# Patient Record
Sex: Female | Born: 1970 | Race: White | Hispanic: No | Marital: Single | Smoking: Current every day smoker
Health system: Southern US, Community
[De-identification: ages and names within clinical notes are randomized; demographics above are authoritative.]

## PROBLEM LIST (undated history)

## (undated) DIAGNOSIS — B1920 Unspecified viral hepatitis C without hepatic coma: Secondary | ICD-10-CM

## (undated) DIAGNOSIS — E119 Type 2 diabetes mellitus without complications: Secondary | ICD-10-CM

---

## 2007-01-06 ENCOUNTER — Emergency Department (HOSPITAL_COMMUNITY): Admission: EM | Admit: 2007-01-06 | Discharge: 2007-01-06 | Payer: Self-pay | Admitting: Emergency Medicine

## 2014-01-07 ENCOUNTER — Other Ambulatory Visit: Payer: Self-pay | Admitting: Nurse Practitioner

## 2014-01-07 DIAGNOSIS — C220 Liver cell carcinoma: Secondary | ICD-10-CM

## 2014-01-17 ENCOUNTER — Other Ambulatory Visit: Payer: Self-pay | Admitting: Nurse Practitioner

## 2014-01-17 ENCOUNTER — Ambulatory Visit
Admission: RE | Admit: 2014-01-17 | Discharge: 2014-01-17 | Disposition: A | Discharge status: Home / Self Care | Payer: Medicaid Other | Source: Ambulatory Visit | Attending: Nurse Practitioner | Admitting: Nurse Practitioner

## 2014-01-17 DIAGNOSIS — C220 Liver cell carcinoma: Secondary | ICD-10-CM

## 2015-05-26 ENCOUNTER — Other Ambulatory Visit (HOSPITAL_COMMUNITY): Payer: Self-pay | Admitting: Nurse Practitioner

## 2015-05-26 DIAGNOSIS — B1920 Unspecified viral hepatitis C without hepatic coma: Secondary | ICD-10-CM

## 2015-09-23 IMAGING — US US ABDOMEN LIMITED
1 series · 14 of 25 positions shown · non-contrast
Comparison: Ultrasound February 17, 2006.

CLINICAL DATA: Right upper quadrant abdominal pain.

EXAM:
US ABDOMEN LIMITED - RIGHT UPPER QUADRANT

[Series 1: us abdomen limited · 0.23mm/px · 14 of 29 slices shown]
[im 1/29]
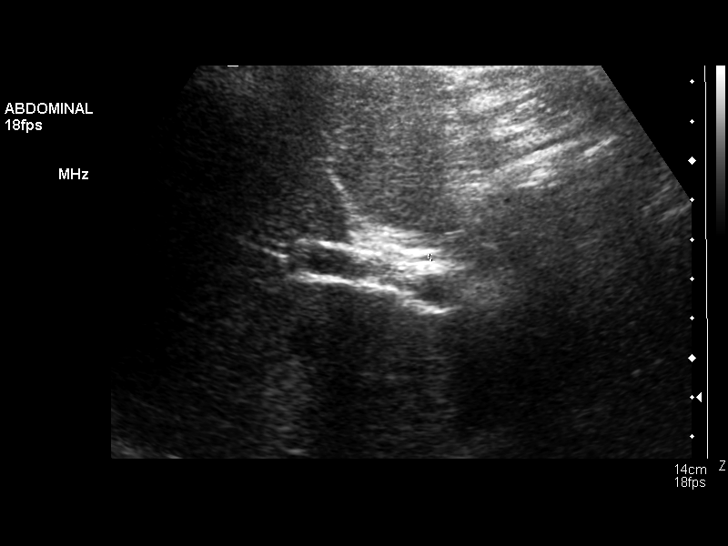
[im 3/29]
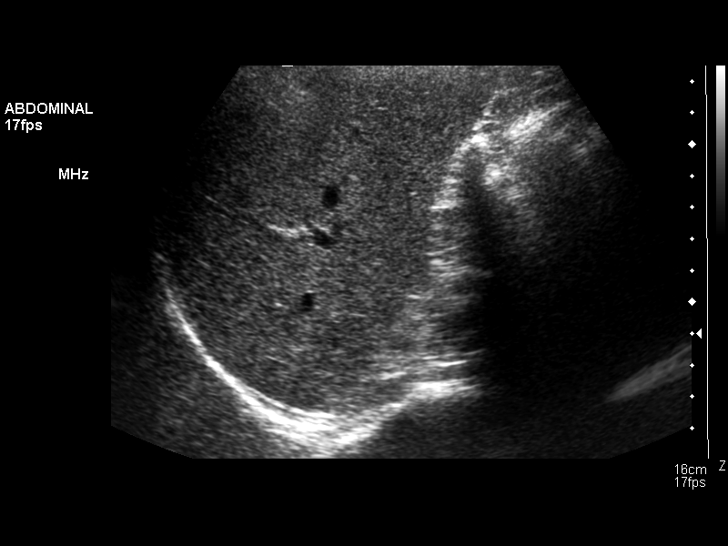
[im 5/29]
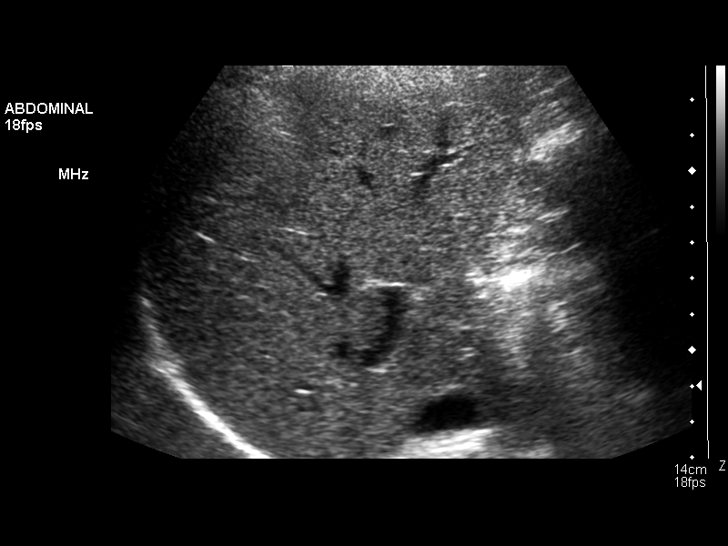
[im 8/29]
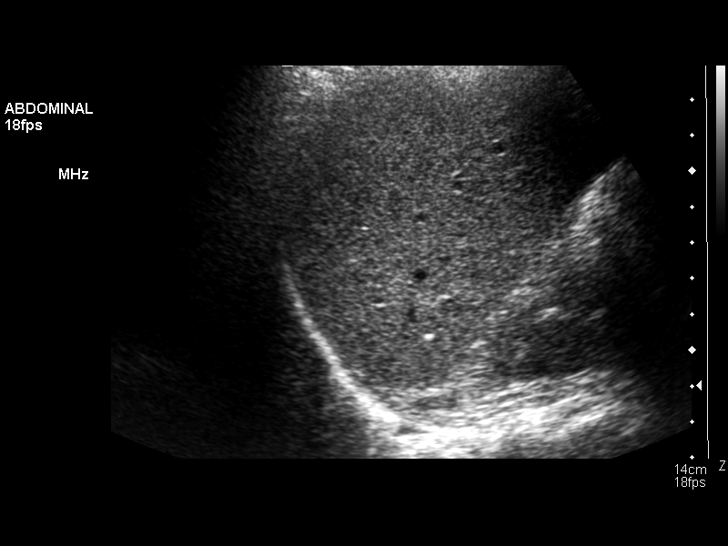
[im 10/29]
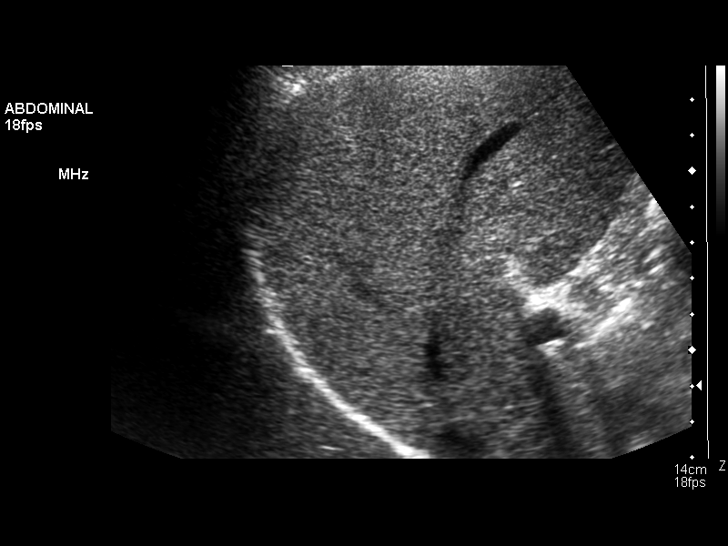
[im 11/29]
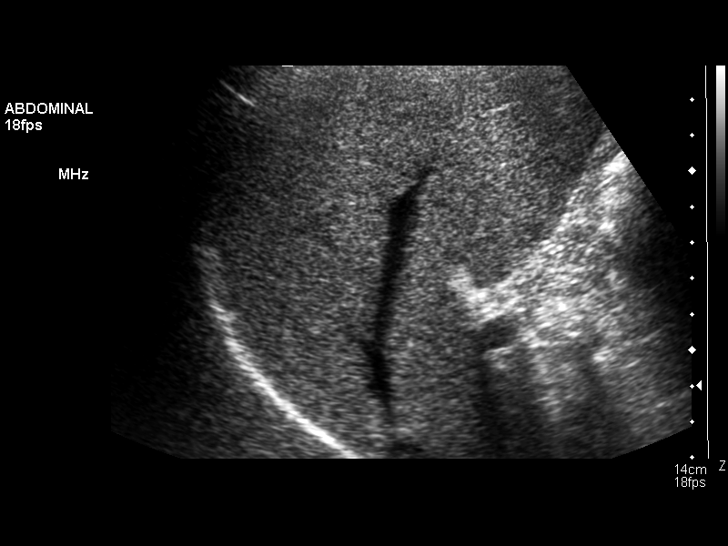
[im 13/29]
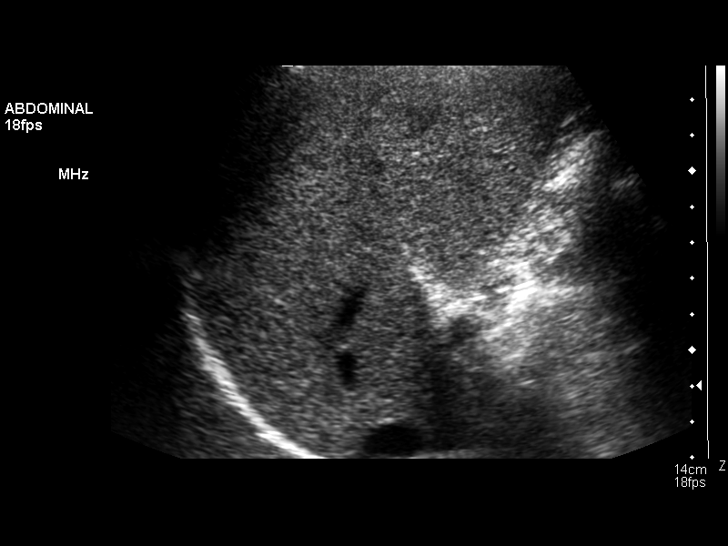
[im 16/29]
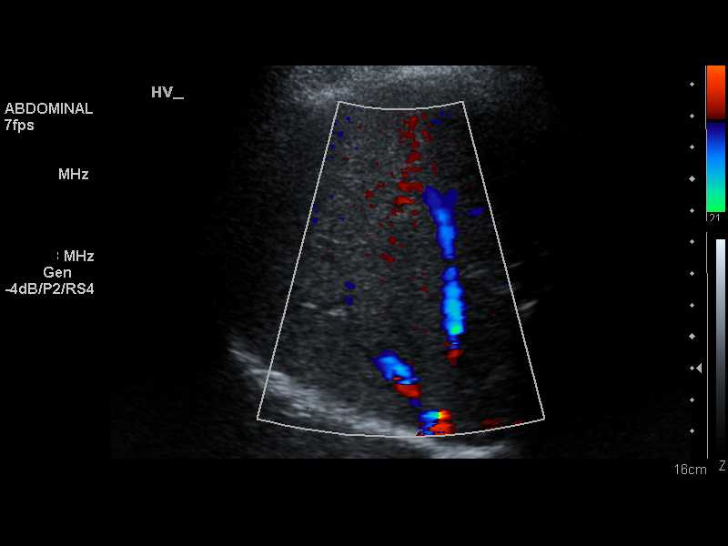
[im 18/29]
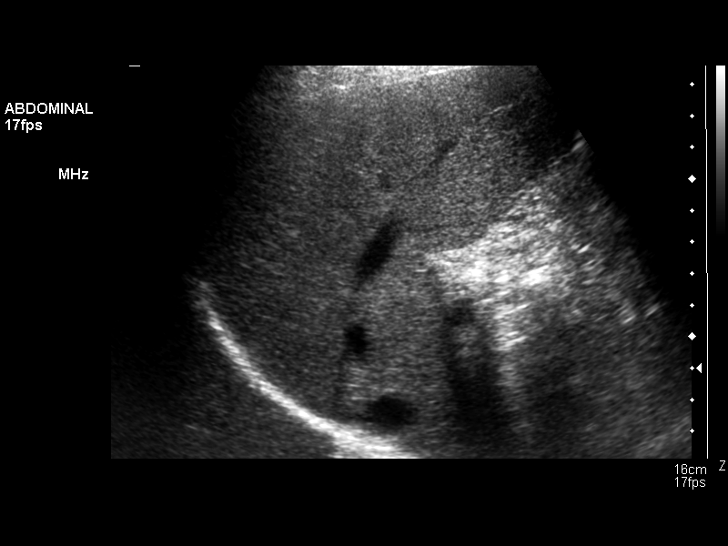
[im 19/29]
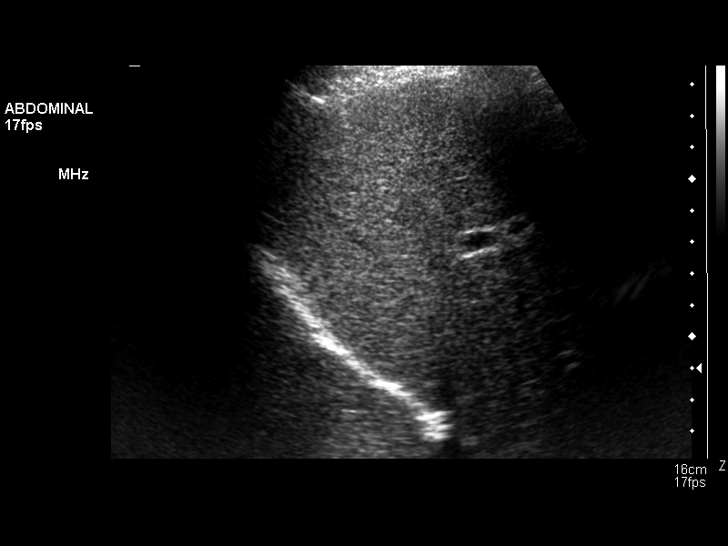
[im 22/29]
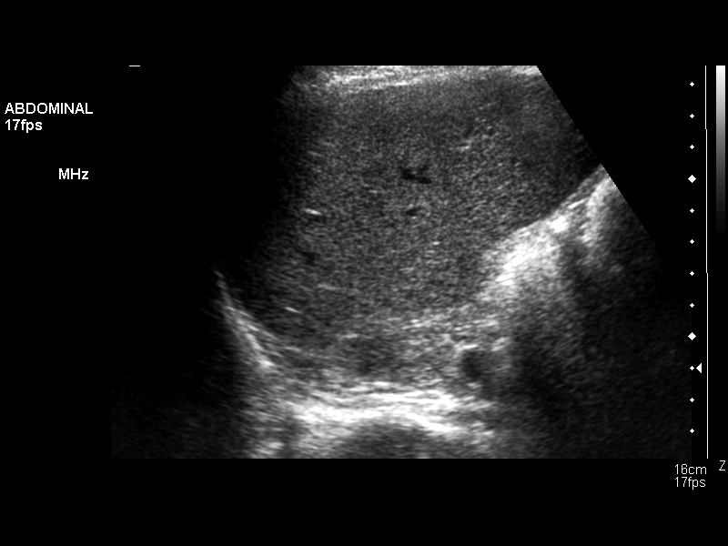
[im 24/29]
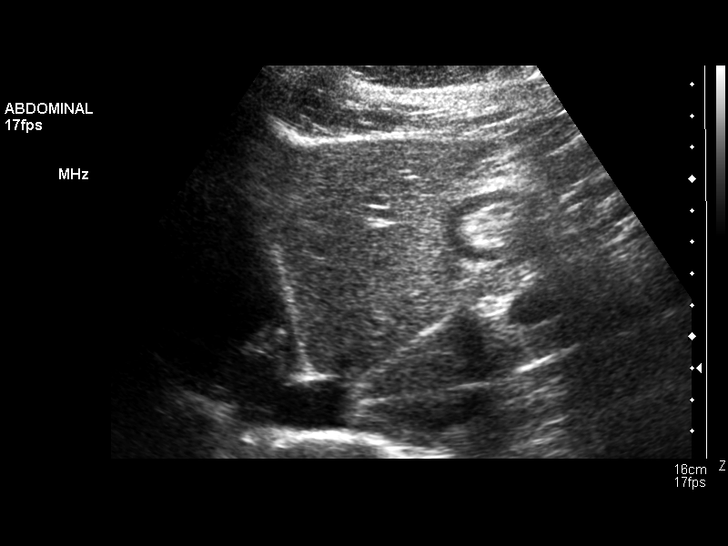
[im 26/29]
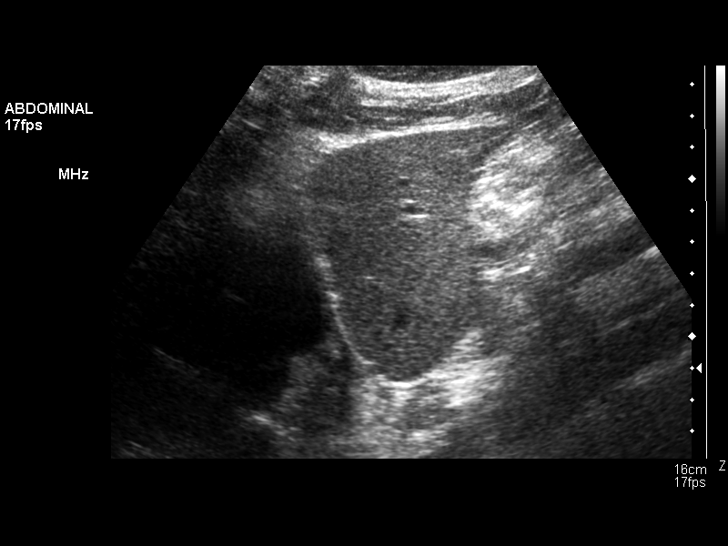
[im 29/29]
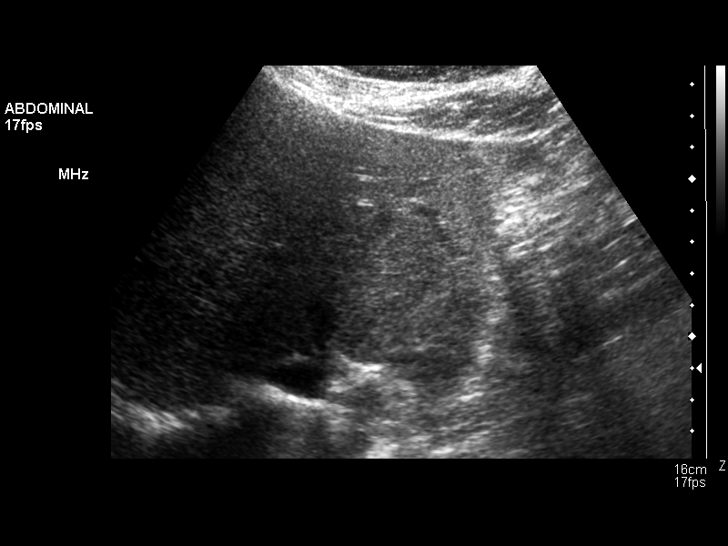

[14 of 25 positions shown; findings below may reference images not displayed]

FINDINGS: Gallbladder:

Status post cholecystectomy.

Common bile duct:

Diameter: Measures 2 mm which is within normal limits.

Liver:

Slightly heterogeneous echotexture of hepatic parenchyma is noted
suggesting possible diffuse hepatocellular disease. No focal
abnormality is noted.
IMPRESSION: Status post cholecystectomy. Slightly heterogeneous echotexture of
hepatic parenchyma is noted suggesting possible diffuse
hepatocellular disease.

## 2019-05-17 ENCOUNTER — Encounter (HOSPITAL_COMMUNITY): Payer: Self-pay | Admitting: Emergency Medicine

## 2019-05-17 ENCOUNTER — Emergency Department (HOSPITAL_COMMUNITY)
Admission: EM | Admit: 2019-05-17 | Discharge: 2019-05-17 | Disposition: A | Discharge status: Home / Self Care | Payer: Medicaid Other | Attending: Emergency Medicine | Admitting: Emergency Medicine

## 2019-05-17 ENCOUNTER — Other Ambulatory Visit: Payer: Self-pay

## 2019-05-17 ENCOUNTER — Emergency Department (HOSPITAL_COMMUNITY): Payer: Medicaid Other

## 2019-05-17 DIAGNOSIS — B182 Chronic viral hepatitis C: Secondary | ICD-10-CM | POA: Insufficient documentation

## 2019-05-17 DIAGNOSIS — F17210 Nicotine dependence, cigarettes, uncomplicated: Secondary | ICD-10-CM | POA: Insufficient documentation

## 2019-05-17 DIAGNOSIS — R102 Pelvic and perineal pain: Secondary | ICD-10-CM | POA: Insufficient documentation

## 2019-05-17 DIAGNOSIS — N939 Abnormal uterine and vaginal bleeding, unspecified: Secondary | ICD-10-CM | POA: Diagnosis not present

## 2019-05-17 HISTORY — DX: Unspecified viral hepatitis C without hepatic coma: B19.20

## 2019-05-17 HISTORY — DX: Type 2 diabetes mellitus without complications: E11.9

## 2019-05-17 LAB — CBC WITH DIFFERENTIAL/PLATELET
Abs Immature Granulocytes: 0.03 10*3/uL (ref 0.00–0.07)
Basophils Absolute: 0 10*3/uL (ref 0.0–0.1)
Basophils Relative: 0 %
Eosinophils Absolute: 0.1 10*3/uL (ref 0.0–0.5)
Eosinophils Relative: 1 %
HCT: 44 % (ref 36.0–46.0)
Hemoglobin: 14.6 g/dL (ref 12.0–15.0)
Immature Granulocytes: 0 %
Lymphocytes Relative: 35 %
Lymphs Abs: 3.4 10*3/uL (ref 0.7–4.0)
MCH: 30 pg (ref 26.0–34.0)
MCHC: 33.2 g/dL (ref 30.0–36.0)
MCV: 90.3 fL (ref 80.0–100.0)
Monocytes Absolute: 0.4 10*3/uL (ref 0.1–1.0)
Monocytes Relative: 5 %
Neutro Abs: 5.6 10*3/uL (ref 1.7–7.7)
Neutrophils Relative %: 59 %
Platelets: 267 10*3/uL (ref 150–400)
RBC: 4.87 MIL/uL (ref 3.87–5.11)
RDW: 14.9 % (ref 11.5–15.5)
WBC: 9.6 10*3/uL (ref 4.0–10.5)
nRBC: 0 % (ref 0.0–0.2)

## 2019-05-17 LAB — WET PREP, GENITAL
Clue Cells Wet Prep HPF POC: NONE SEEN
Sperm: NONE SEEN
Trich, Wet Prep: NONE SEEN
WBC, Wet Prep HPF POC: NONE SEEN
Yeast Wet Prep HPF POC: NONE SEEN

## 2019-05-17 LAB — BASIC METABOLIC PANEL
Anion gap: 10 (ref 5–15)
BUN: 8 mg/dL (ref 6–20)
CO2: 23 mmol/L (ref 22–32)
Calcium: 8.8 mg/dL — ABNORMAL LOW (ref 8.9–10.3)
Chloride: 106 mmol/L (ref 98–111)
Creatinine, Ser: 0.6 mg/dL (ref 0.44–1.00)
GFR calc Af Amer: >60 mL/min (ref >60)
GFR calc non Af Amer: >60 mL/min (ref >60)
Glucose, Bld: 120 mg/dL — ABNORMAL HIGH (ref 70–99)
Potassium: 4.6 mmol/L (ref 3.5–5.1)
Sodium: 139 mmol/L (ref 135–145)

## 2019-05-17 LAB — I-STAT BETA HCG BLOOD, ED (MC, WL, AP ONLY): I-stat hCG, quantitative: <5 m[IU]/mL (ref <5)

## 2019-05-17 NOTE — Discharge Instructions (Signed)
Return for severely worsening bleeding, severe abdominal pain, fever.

## 2019-05-17 NOTE — ED Provider Notes (Addendum)
[facility] EMERGENCY DEPARTMENT Provider Note   CSN: [id number] Arrival date & time: 05/17/19  1316     History   Chief Complaint Chief Complaint  Patient presents with  . Vaginal Bleeding    HPI Nancy Gates is a 48 y.o. female with past medical history of hepatitis C, PCOS, presenting to the emergency department with complaint of 5 days of vaginal bleeding.  She states she initially thought she was transitioning to menopause as she did not have a period for about 3 months, however 5 days ago began a menstrual period that began similar to typical natural periods.  She states however her bleeding was much heavier than usual, feeling as though she is going through 1 sanitary napkin every hour.  Starting to feel some lightheadedness.  She also endorses lower abdominal cramping and waves of pain.  She is not on any contraceptives, reports tubal ligation in 2000.  No anticoagulants.  No known vaginal injuries.  No known history of uterine fibroids.     The history is provided by the patient.    Past Medical History:  Diagnosis Date  . Diabetes mellitus without complication (HCC)    getsational  . Hepatitis C     There are no active problems to display for this patient.   History reviewed. No pertinent surgical history.   OB History   No obstetric history on file.      Home Medications    Prior to Admission medications   Not on File    Family History No family history on file.  Social History Social History   Tobacco Use  . Smoking status: Current Every Day Smoker  . Smokeless tobacco: Never Used  Substance Use Topics  . Alcohol use: Not Currently  . Drug use: Not Currently     Allergies   Bee venom   Review of Systems Review of Systems  All other systems reviewed and are negative.    Physical Exam Updated Vital Signs BP 127/68 (BP Location: Right Arm)   Pulse 72   Temp 98.7 F (37.1 C) (Oral)   Resp (!) 28   SpO2 100%   Physical Exam Vitals signs and nursing note reviewed.  Constitutional:      General: She is not in acute distress.    Appearance: She is well-developed.  HENT:     Head: Normocephalic and atraumatic.  Eyes:     Conjunctiva/sclera: Conjunctivae normal.  Cardiovascular:     Rate and Rhythm: Normal rate and regular rhythm.  Pulmonary:     Effort: Pulmonary effort is normal. No respiratory distress.     Breath sounds: Normal breath sounds.  Abdominal:     General: Bowel sounds are normal.     Palpations: Abdomen is soft.     Tenderness: There is no abdominal tenderness. There is no guarding or rebound.  Genitourinary:    Comments: Small amount of blood present at the cervical os, no profuse or active bleeding noted on exam.  No obvious vaginal lacerations.  There is significant tenderness with palpation of the cervix though bilateral adnexa without tenderness. Skin:    General: Skin is warm.  Neurological:     Mental Status: She is alert.  Psychiatric:        Behavior: Behavior normal.      ED Treatments / Results  Labs (all labs ordered are listed, but only abnormal results are displayed) Labs Reviewed  BASIC METABOLIC PANEL - Abnormal; Notable for the following  components:      Result Value   Glucose, Bld 120 (*)    Calcium 8.8 (*)    All other components within normal limits  WET PREP, GENITAL  CBC WITH DIFFERENTIAL/PLATELET  I-STAT BETA HCG BLOOD, ED ([facility], [facility], [facility] ONLY)  GC/CHLAMYDIA PROBE AMP ([place]) NOT AT [facility]    EKG None  Radiology US Pelvic Complete With Transvaginal  Result Date: 05/17/2019 CLINICAL DATA:  Heavy vaginal bleeding for 5 days after 3 months of amenorrhea EXAM: TRANSABDOMINAL AND TRANSVAGINAL ULTRASOUND OF PELVIS TECHNIQUE: Both transabdominal and transvaginal ultrasound examinations of the pelvis were performed. Transabdominal technique was performed for global imaging of the pelvis including uterus, ovaries, adnexal regions, and pelvic  cul-de-sac. It was necessary to proceed with endovaginal exam following the transabdominal exam to visualize the uterus, endometrium, and ovaries. COMPARISON:  None FINDINGS: Uterus Measurements: 9.6 x 4.6 x 5.8 cm = volume: 134.1 mL. No fibroids or other mass visualized. Endometrium Thickness: 6.6 mm.  No focal abnormality visualized. Right ovary Measurements: 2.5 x 1.4 x 1.5 cm = volume: 2.7 mL. 1.3 cm anechoic cyst appears to arise from the right ovary/adnexa, likely dominant follicle. No concerning adnexal lesions. Left ovary Not visualized. Other findings Technically difficult study due to bowel gas and body habitus. IMPRESSION: Nonvisualization of the left ovary. Otherwise grossly normal pelvic ultrasound. Electronically Signed   By: Lovena Le M.D.   On: 05/17/2019 16:14    Procedures Procedures (including critical care time)  Medications Ordered in ED Medications - No data to display   Initial Impression / Assessment and Plan / ED Course  I have reviewed the triage vital signs and the nursing notes.  Pertinent labs & imaging results that were available during my care of the patient were reviewed by me and considered in my medical decision making (see chart for details).        Patient presenting for 5 days of vaginal bleeding, heavier than usual.  She endorses some lightheadedness as well, however hemoglobin is stable at 14.6 and vital signs are normal.  Pelvic exam reveals no active or profuse bleeding, no obvious vaginal lacerations to account for bleeding.  She is tender at the cervix, however bilateral adnexa without tenderness.  Given patient's significant tenderness, proceed with ultrasound for further evaluation.  U/S is unremarkable. Wet prep is normal, unlikely infection. Given reassuring exam and lab work-up today, do not believe Megace is necessary at this time.  Will discharge with instructions for close outpt follow up and strict return precautions. Pt agrees with care  plan and discharge.  Final Clinical Impressions(s) / ED Diagnoses   Final diagnoses:  Vaginal bleeding  Pelvic pain    ED Discharge Orders    None       Haidee Stogsdill, [place] N, PA-C 05/17/19 1659    Joesiah Lonon, [place] N, [place] 05/17/19 1700    Maudie Flakes, MD 05/19/19 1500

## 2019-05-17 NOTE — ED Triage Notes (Signed)
First period she has had in 3 months states it super heavy and she has cramping and dizzy,  She is on day 5  States changing pads every hour , Tuble lig in 2000

## 2019-05-17 NOTE — ED Notes (Signed)
Patient transported to Ultrasound 

## 2019-05-21 LAB — GC/CHLAMYDIA PROBE AMP (~~LOC~~) NOT AT ARMC
Chlamydia: NEGATIVE
Neisseria Gonorrhea: NEGATIVE

## 2021-01-20 IMAGING — US US PELVIS COMPLETE WITH TRANSVAGINAL
1 series · 14 of 25 positions shown · non-contrast
Comparison: None

CLINICAL DATA: Heavy vaginal bleeding for 5 days after 3 months of
amenorrhea



[Series 1: us pelvis complete with transvaginal · 71 acquisitions, 14 frames shown]
[im 1/71]
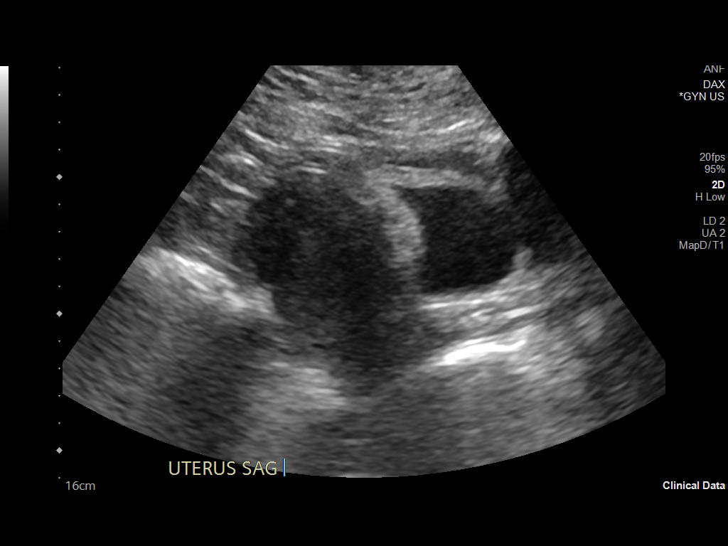
[im 6/71]
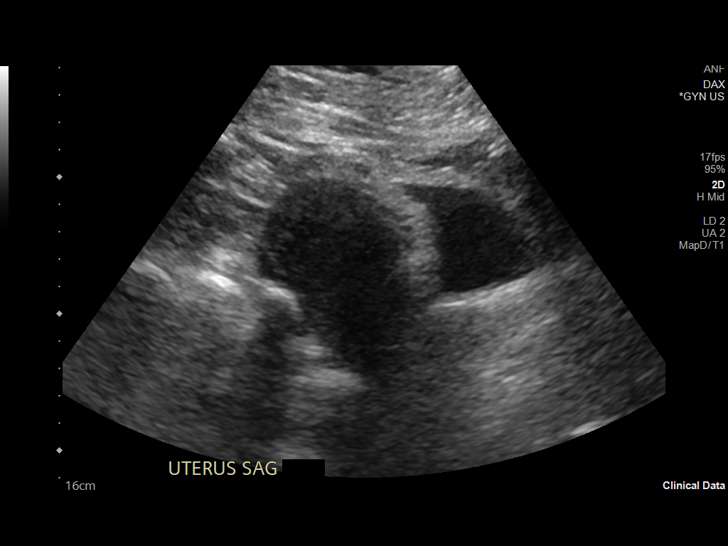
[im 12/71]
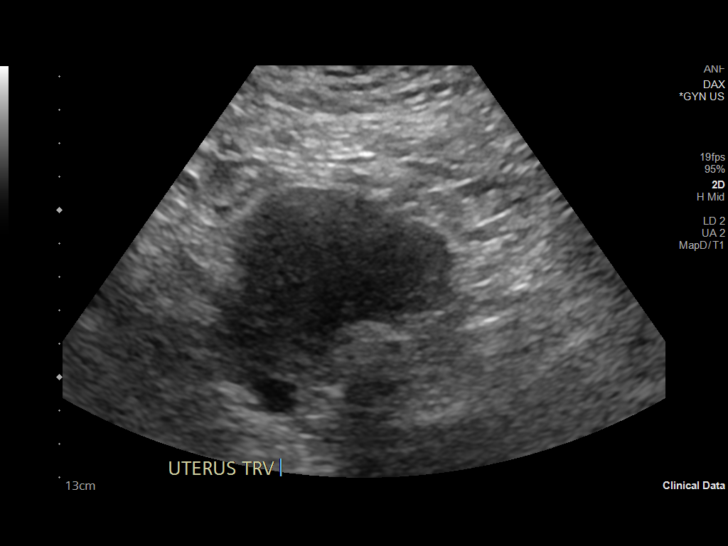
[im 18/71]
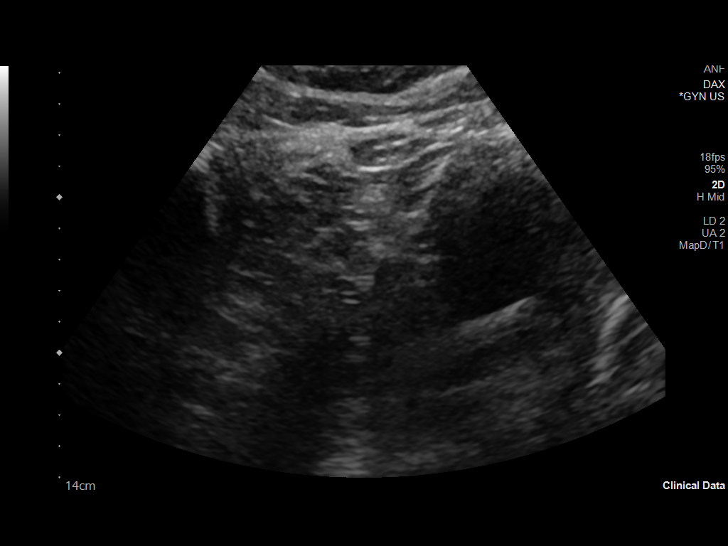
[im 24/71]
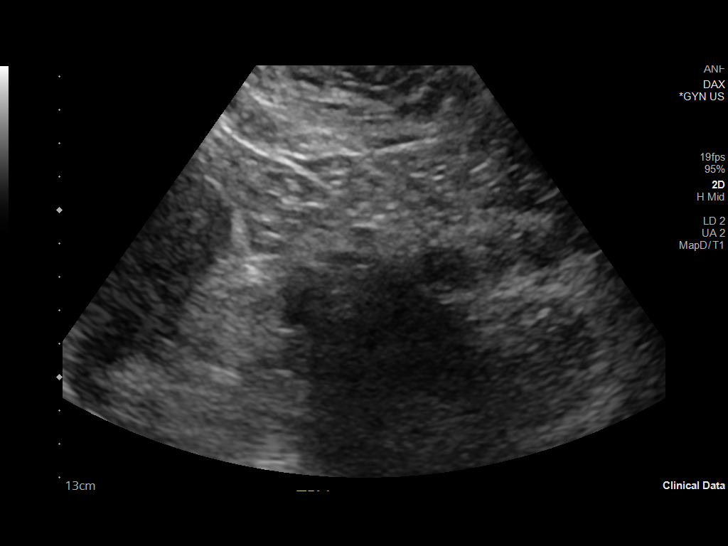
[im 27/71]
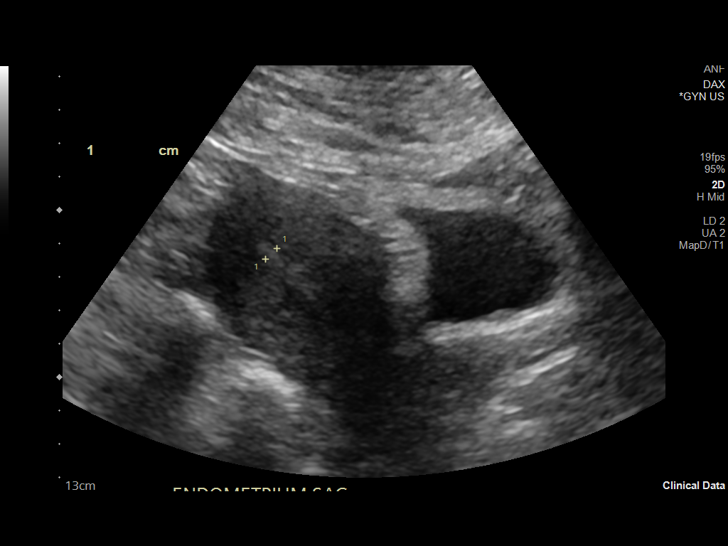
[im 33/71]
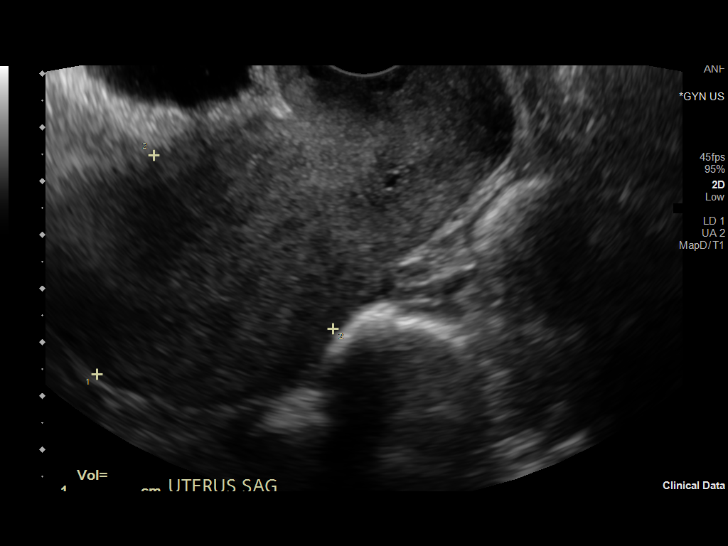
[im 38/71]
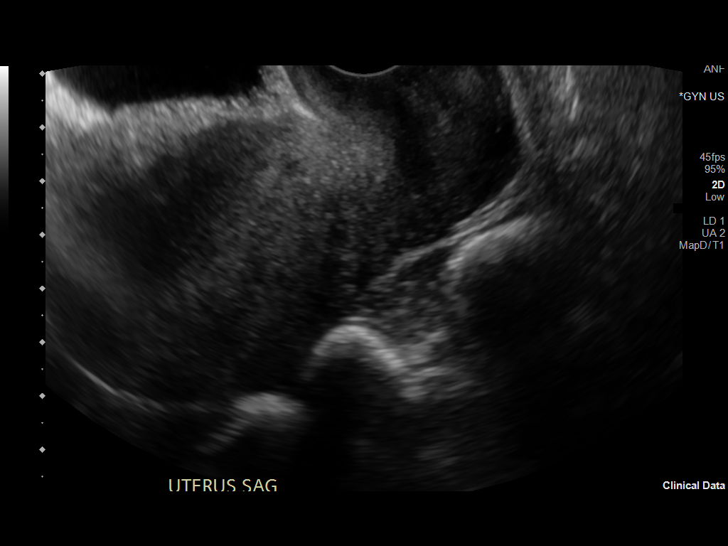
[im 44/71]
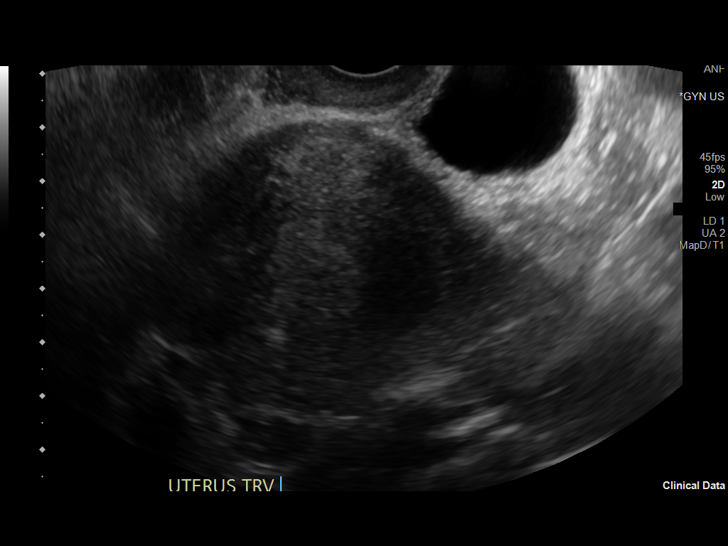
[im 47/71]
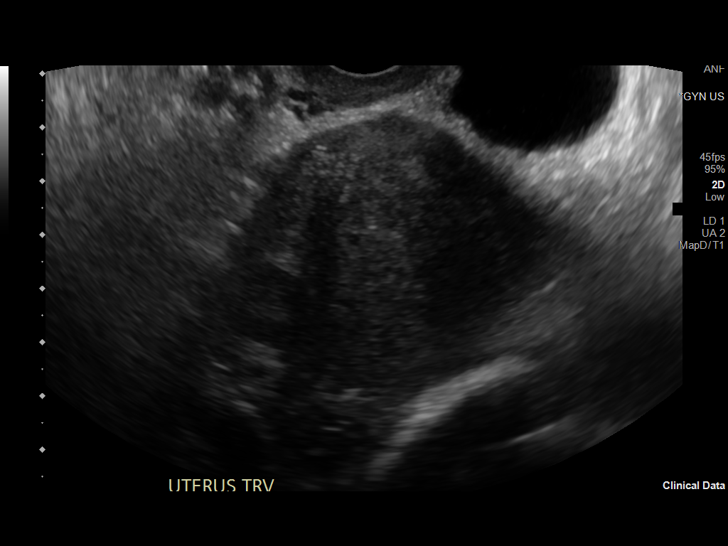
[im 53/71]
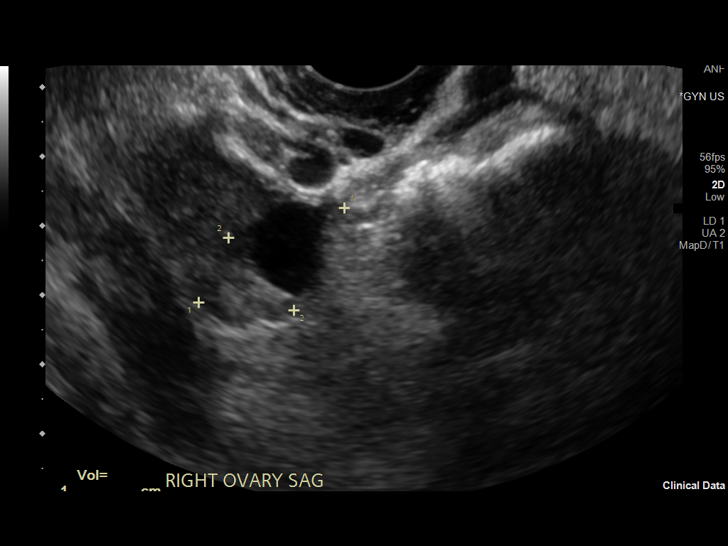
[im 59/71]
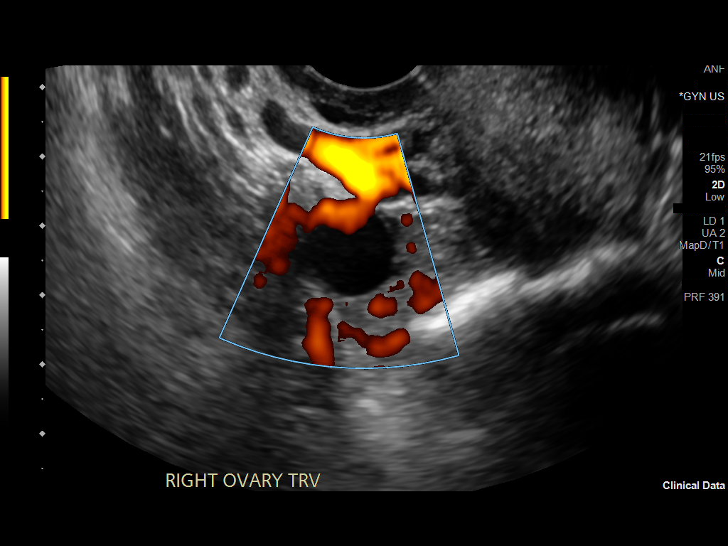
[im 65/71]
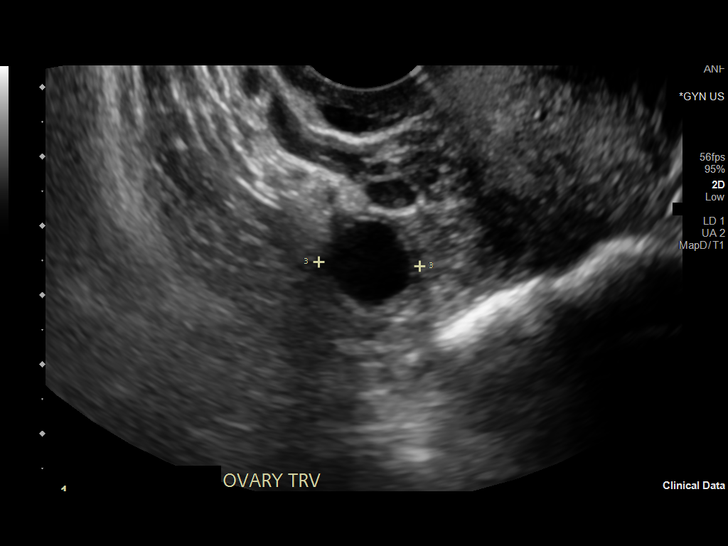
[im 71/71]
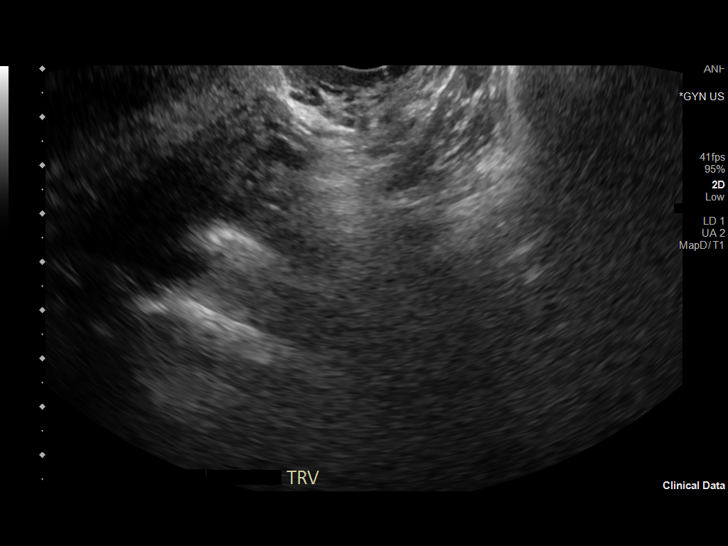

[14 of 25 positions shown; findings below may reference images not displayed]

FINDINGS: Uterus

Measurements: 9.6 x 4.6 x 5.8 cm = volume: 134.1 mL. No fibroids or
other mass visualized.

Endometrium

Thickness: 6.6 mm.  No focal abnormality visualized.

Right ovary

Measurements: 2.5 x 1.4 x 1.5 cm = volume: 2.7 mL. 1.3 cm anechoic
cyst appears to arise from the right ovary/adnexa, likely dominant
follicle. No concerning adnexal lesions.

Left ovary

Not visualized.

Other findings

Technically difficult study due to bowel gas and body habitus.
IMPRESSION: Nonvisualization of the left ovary.

Otherwise grossly normal pelvic ultrasound.
